# Patient Record
Sex: Female | Born: 2000 | Race: White | Hispanic: No | Marital: Single | Smoking: Current every day smoker
Health system: Southern US, Community
[De-identification: ages and names within clinical notes are randomized; demographics above are authoritative.]

## PROBLEM LIST (undated history)

## (undated) DIAGNOSIS — D649 Anemia, unspecified: Secondary | ICD-10-CM

## (undated) DIAGNOSIS — J45909 Unspecified asthma, uncomplicated: Secondary | ICD-10-CM

## (undated) DIAGNOSIS — B029 Zoster without complications: Secondary | ICD-10-CM

## (undated) HISTORY — PX: APPENDECTOMY: SHX54

## (undated) HISTORY — PX: TONSILLECTOMY: SUR1361

## (undated) HISTORY — PX: ADENOIDECTOMY: SUR15

---
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
## 2020-02-04 ENCOUNTER — Other Ambulatory Visit: Payer: Self-pay

## 2020-02-07 ENCOUNTER — Encounter: Payer: Self-pay | Admitting: Family Medicine

## 2020-02-07 ENCOUNTER — Other Ambulatory Visit: Payer: Self-pay

## 2020-02-07 ENCOUNTER — Ambulatory Visit: Payer: 59 | Admitting: Family Medicine

## 2020-02-07 VITALS — BP 118/82 | HR 101 | Temp 99.8°F | Ht 66.07 in | Wt 224.2 lb

## 2020-02-07 DIAGNOSIS — R42 Dizziness and giddiness: Secondary | ICD-10-CM | POA: Diagnosis not present

## 2020-02-07 DIAGNOSIS — Z833 Family history of diabetes mellitus: Secondary | ICD-10-CM | POA: Diagnosis not present

## 2020-02-07 DIAGNOSIS — R5383 Other fatigue: Secondary | ICD-10-CM

## 2020-02-07 DIAGNOSIS — R55 Syncope and collapse: Secondary | ICD-10-CM

## 2020-02-07 LAB — URINALYSIS, COMPLETE
Bilirubin, UA: NEGATIVE
Glucose, UA: NEGATIVE
Ketones, UA: NEGATIVE
Leukocytes,UA: NEGATIVE
Nitrite, UA: NEGATIVE
Protein,UA: NEGATIVE
RBC, UA: NEGATIVE
Specific Gravity, UA: 1.015 (ref 1.005–1.030)
Urobilinogen, Ur: 0.2 mg/dL (ref 0.2–1.0)
pH, UA: 7.5 (ref 5.0–7.5)

## 2020-02-07 LAB — BAYER DCA HB A1C WAIVED: HB A1C (BAYER DCA - WAIVED): 4.9 % (ref <7.0)

## 2020-02-07 LAB — MICROSCOPIC EXAMINATION
Epithelial Cells (non renal): >10 /hpf — AB (ref 0–10)
RBC: NONE SEEN /hpf (ref 0–2)
Renal Epithel, UA: NONE SEEN /hpf

## 2020-02-07 LAB — GLUCOSE HEMOCUE WAIVED: Glu Hemocue Waived: 94 mg/dL (ref 65–99)

## 2020-02-07 NOTE — Progress Notes (Signed)
BP 118/82   Pulse (!) 101   Temp 99.8 F (37.7 C) (Temporal)   Ht 5' 6.07" (1.678 m)   Wt 224 lb 3.2 oz (101.7 kg)   LMP 01/13/2020 (Approximate)   SpO2 92%   BMI 36.11 kg/m    Subjective:   Patient ID: Melissa Weeks, female    DOB: 06/01/2001, 19 y.o.   MRN: [id number]  HPI: Melissa Weeks is a 19 y.o. female presenting on 02/07/2020 for Dizziness (Patient states that she feels like her bs is dropping.  Has been going on since Nov but now worse. Will come out of no where and feel dizzy and feel like her bs is dropping)   HPI Patient is coming in today with complaints of dizziness and lightheadedness episodes that have been occurring over the past few months.  She says that she will feel shaky and lightheaded and nauseous and it will usually last between 10 and 30 minutes and it improves after eating something sugary.  She says is been increasing over the past few months and frequency and will happen every few days sometimes.  She has been at college more and has been stressed but she is seeing a counselor for that and she says that is working well and has not led to or been associated with these episodes.  She is concerned about her blood sugar.  She does have a mother who has type 1 diabetes.  She also complains of some heat intolerance and thirst that is been going on over the past few months as well.  She does have irritable bowel syndrome and get some diarrhea from that but it is been no more than she has had most of her life.  She says she does get some minor headaches associated with it but nothing major.  She says one time it happened out at [employer] with her father.  She has never fully passed out and has been able to sit down and eat and feel better most of the time.  She does try to drink water but does not know if she has been dehydrated.  Relevant past medical, surgical, family and social history reviewed and updated as indicated. Interim medical history since our last visit  reviewed. Allergies and medications reviewed and updated.  Review of Systems  Constitutional: Negative for chills and fever.  HENT: Negative for congestion, ear discharge and ear pain.   Eyes: Negative for redness and visual disturbance.  Respiratory: Negative for chest tightness and shortness of breath.   Cardiovascular: Negative for chest pain and leg swelling.  Endocrine: Positive for heat intolerance and polydipsia. Negative for cold intolerance and polyuria.  Genitourinary: Negative for difficulty urinating and dysuria.  Musculoskeletal: Negative for back pain and gait problem.  Skin: Negative for rash.  Neurological: Positive for dizziness and light-headedness. Negative for seizures, weakness and headaches.  Psychiatric/Behavioral: Negative for agitation and behavioral problems.  All other systems reviewed and are negative.   Per HPI unless specifically indicated above   Allergies as of 02/07/2020   No Known Allergies     Medication List       Accurate as of February 07, 2020  4:45 PM. If you have any questions, ask your nurse or doctor.        levonorgestrel-ethinyl estradiol 0.1-20 MG-MCG tablet Commonly known as: Sronyx Take 1 tablet by mouth daily.        Objective:   BP 118/82   Pulse (!) 101   Temp  99.8 F (37.7 C) (Temporal)   Ht 5' 6.07" (1.678 m)   Wt 224 lb 3.2 oz (101.7 kg)   LMP 01/13/2020 (Approximate)   SpO2 92%   BMI 36.11 kg/m   Wt Readings from Last 3 Encounters:  02/07/20 224 lb 3.2 oz (101.7 kg) (99 %, Z= 2.24)*  02/15/19 216 lb 6.4 oz (98.2 kg) (99 %, Z= 2.17)*  12/14/18 207 lb (93.9 kg) (98 %, Z= 2.08)*   * Growth percentiles are based on CDC (Girls, 2-20 Years) data.    Physical Exam Vitals and nursing note reviewed.  Constitutional:      General: She is not in acute distress.    Appearance: She is well-developed. She is not diaphoretic.  Eyes:     Conjunctiva/sclera: Conjunctivae normal.  Cardiovascular:     Rate and Rhythm:  Normal rate and regular rhythm.     Heart sounds: Normal heart sounds. No murmur.  Pulmonary:     Effort: Pulmonary effort is normal. No respiratory distress.     Breath sounds: Normal breath sounds. No wheezing.  Abdominal:     General: Abdomen is flat. Bowel sounds are normal. There is no distension.     Tenderness: There is no abdominal tenderness. There is no right CVA tenderness or left CVA tenderness.  Musculoskeletal:        General: No tenderness. Normal range of motion.  Skin:    General: Skin is warm and dry.     Findings: No rash.  Neurological:     Mental Status: She is alert and oriented to person, place, and time.     Coordination: Coordination normal.  Psychiatric:        Behavior: Behavior normal.       Assessment & Plan:   Problem List Items Addressed This Visit    None    Visit Diagnoses    Fatigue, unspecified type    -  Primary   Relevant Orders   Glucose Hemocue Waived (Completed)   CBC with Differential/Platelet   CMP14+EGFR   Thyroid Panel With TSH   Urinalysis, Complete   Bayer DCA Hb A1c Waived   Near syncope       Relevant Orders   CBC with Differential/Platelet   CMP14+EGFR   Thyroid Panel With TSH   Urinalysis, Complete   Bayer DCA Hb A1c Waived   Episodic lightheadedness       Relevant Orders   CBC with Differential/Platelet   CMP14+EGFR   Thyroid Panel With TSH   Urinalysis, Complete   Bayer DCA Hb A1c Waived   Family history of type 1 diabetes mellitus       Relevant Orders   CBC with Differential/Platelet   CMP14+EGFR   Thyroid Panel With TSH   Urinalysis, Complete   Bayer DCA Hb A1c Waived    There is a chance that it could be functional hypoglycemic episodes, her mother has type 1 diabetes so they will check her blood sugar when it occurs.  We will check blood work for any other possible issues.  Blood sugar 94 today  Follow up plan: Return in about 6 weeks (around 03/20/2020), or if symptoms worsen or fail to improve,  for Follow-up in 1 to 2 months on lightheadedness.  Counseling provided for all of the vaccine components Orders Placed This Encounter  Procedures  . Glucose Hemocue Waived  . CBC with Differential/Platelet  . CMP14+EGFR  . Thyroid Panel With TSH  . Urinalysis, Complete  . B[facility]  Hb A1c [place], MD [facility] 02/07/2020, 4:45 PM

## 2020-02-08 LAB — CMP14+EGFR
ALT: 12 IU/L (ref 0–32)
AST: 17 IU/L (ref 0–40)
Albumin/Globulin Ratio: 1.3 (ref 1.2–2.2)
Albumin: 4.4 g/dL (ref 3.9–5.0)
Alkaline Phosphatase: 78 IU/L (ref 43–101)
BUN/Creatinine Ratio: 8 — ABNORMAL LOW (ref 9–23)
BUN: 7 mg/dL (ref 6–20)
Bilirubin Total: 0.4 mg/dL (ref 0.0–1.2)
CO2: 21 mmol/L (ref 20–29)
Calcium: 9.9 mg/dL (ref 8.7–10.2)
Chloride: 102 mmol/L (ref 96–106)
Creatinine, Ser: 0.86 mg/dL (ref 0.57–1.00)
GFR calc Af Amer: 114 mL/min/{1.73_m2} (ref >59)
GFR calc non Af Amer: 99 mL/min/{1.73_m2} (ref >59)
Globulin, Total: 3.4 g/dL (ref 1.5–4.5)
Glucose: 79 mg/dL (ref 65–99)
Potassium: 4.8 mmol/L (ref 3.5–5.2)
Sodium: 138 mmol/L (ref 134–144)
Total Protein: 7.8 g/dL (ref 6.0–8.5)

## 2020-02-08 LAB — CBC WITH DIFFERENTIAL/PLATELET
Basophils Absolute: 0 10*3/uL (ref 0.0–0.2)
Basos: 0 %
EOS (ABSOLUTE): 0 10*3/uL (ref 0.0–0.4)
Eos: 0 %
Hematocrit: 45.5 % (ref 34.0–46.6)
Hemoglobin: 14.9 g/dL (ref 11.1–15.9)
Immature Grans (Abs): 0 10*3/uL (ref 0.0–0.1)
Immature Granulocytes: 0 %
Lymphocytes Absolute: 1.6 10*3/uL (ref 0.7–3.1)
Lymphs: 18 %
MCH: 27.5 pg (ref 26.6–33.0)
MCHC: 32.7 g/dL (ref 31.5–35.7)
MCV: 84 fL (ref 79–97)
Monocytes Absolute: 0.6 10*3/uL (ref 0.1–0.9)
Monocytes: 7 %
Neutrophils Absolute: 6.6 10*3/uL (ref 1.4–7.0)
Neutrophils: 75 %
Platelets: 293 10*3/uL (ref 150–450)
RBC: 5.42 x10E6/uL — ABNORMAL HIGH (ref 3.77–5.28)
RDW: 13 % (ref 11.7–15.4)
WBC: 8.8 10*3/uL (ref 3.4–10.8)

## 2020-02-08 LAB — THYROID PANEL WITH TSH
Free Thyroxine Index: 3.7 (ref 1.2–4.9)
T3 Uptake Ratio: 25 % (ref 23–35)
T4, Total: 14.6 ug/dL — ABNORMAL HIGH (ref 4.5–12.0)
TSH: 0.874 u[IU]/mL (ref 0.450–4.500)

## 2020-03-04 ENCOUNTER — Other Ambulatory Visit: Payer: Self-pay | Admitting: Family Medicine

## 2020-03-04 DIAGNOSIS — Z30011 Encounter for initial prescription of contraceptive pills: Secondary | ICD-10-CM

## 2020-03-15 ENCOUNTER — Other Ambulatory Visit: Payer: Self-pay

## 2020-03-15 ENCOUNTER — Ambulatory Visit: Payer: 59 | Admitting: Family Medicine

## 2020-03-15 ENCOUNTER — Encounter: Payer: Self-pay | Admitting: Family Medicine

## 2020-03-15 VITALS — BP 119/76 | HR 85 | Temp 99.3°F | Ht 66.0 in | Wt 219.4 lb

## 2020-03-15 DIAGNOSIS — E162 Hypoglycemia, unspecified: Secondary | ICD-10-CM | POA: Diagnosis not present

## 2020-03-15 NOTE — Progress Notes (Signed)
BP 119/76   Pulse 85   Temp 99.3 F (37.4 C)   Ht 5\' 6"  (1.676 m)   Wt 219 lb 6 oz (99.5 kg)   SpO2 100%   BMI 35.41 kg/m    Subjective:   Patient ID: , female    DOB: [birth year], 19 y.o.   MRN: [id number]  HPI: Melissa Weeks is a 19 y.o. female presenting on 03/15/2020 for Medical Management of Chronic Issues and Fatigue (and lightheadedness. Less frequent, but improved since last visit)   HPI Patient comes in today to rediscuss lightheadedness and dizziness.  She has kept a log of it and it seems like every time it happened has been on a day where she did not eat breakfast in the morning and she has a little bit of lightheadedness and then she eats something that goes away.  She has had 2 episodes on February 10 and one episode on February 13 and one episode yesterday and one episode this morning.  It is always been related to her not eating and then she does something and then has the episode and is when she eats it goes away.  Relevant past medical, surgical, family and social history reviewed and updated as indicated. Interim medical history since our last visit reviewed. Allergies and medications reviewed and updated.  Review of Systems  Constitutional: Negative for chills and fever.  Eyes: Negative for visual disturbance.  Respiratory: Negative for chest tightness and shortness of breath.   Cardiovascular: Negative for chest pain and leg swelling.  Musculoskeletal: Negative for back pain and gait problem.  Skin: Negative for rash.  Neurological: Positive for dizziness and light-headedness. Negative for headaches.  Psychiatric/Behavioral: Negative for agitation and behavioral problems.  All other systems reviewed and are negative.   Per HPI unless specifically indicated above   Allergies as of 03/15/2020   No Known Allergies     Medication List       Accurate as of March 15, 2020 12:40 PM. If you have any questions, ask your nurse or doctor.         Sronyx 0.1-20 MG-MCG tablet Generic drug: levonorgestrel-ethinyl estradiol TAKE 1 TABLET BY MOUTH EVERY DAY        Objective:   BP 119/76   Pulse 85   Temp 99.3 F (37.4 C)   Ht 5\' 6"  (1.676 m)   Wt 219 lb 6 oz (99.5 kg)   SpO2 100%   BMI 35.41 kg/m   Wt Readings from Last 3 Encounters:  03/15/20 219 lb 6 oz (99.5 kg) (99 %, Z= 2.19)*  02/07/20 224 lb 3.2 oz (101.7 kg) (99 %, Z= 2.24)*  02/15/19 216 lb 6.4 oz (98.2 kg) (99 %, Z= 2.17)*   * Growth percentiles are based on CDC (Girls, 2-20 Years) data.    Physical Exam Vitals and nursing note reviewed.  Constitutional:      General: She is not in acute distress.    Appearance: She is well-developed. She is not diaphoretic.  Eyes:     Conjunctiva/sclera: Conjunctivae normal.  Cardiovascular:     Rate and Rhythm: Normal rate and regular rhythm.     Heart sounds: Normal heart sounds. No murmur.  Pulmonary:     Effort: Pulmonary effort is normal. No respiratory distress.     Breath sounds: Normal breath sounds. No wheezing.  Musculoskeletal:        General: No tenderness. Normal range of motion.  Skin:    General: Skin is  warm and dry.  Neurological:     Mental Status: She is alert and oriented to person, place, and time.     Coordination: Coordination normal.  Psychiatric:        Behavior: Behavior normal.       Assessment & Plan:   Problem List Items Addressed This Visit    None    Visit Diagnoses    Nondiabetic hypoglycemia    -  Primary    Hormonal testing was normal, for now just keep snack with her, if it worsens we could always discuss with an endocrinology.  Follow up plan: Return if symptoms worsen or fail to improve.  Counseling provided for all of the vaccine components No orders of the defined types were placed in this encounter.   Caryl Pina, MD [facility] 03/15/2020, 12:40 PM

## 2020-03-24 ENCOUNTER — Ambulatory Visit: Payer: 59 | Admitting: Family Medicine

## 2020-05-06 ENCOUNTER — Other Ambulatory Visit: Payer: Self-pay | Admitting: Nurse Practitioner

## 2020-05-06 ENCOUNTER — Other Ambulatory Visit: Payer: Self-pay | Admitting: Family Medicine

## 2020-05-06 DIAGNOSIS — Z30011 Encounter for initial prescription of contraceptive pills: Secondary | ICD-10-CM

## 2020-05-06 MED ORDER — LEVONORGESTREL-ETHINYL ESTRAD 0.1-20 MG-MCG PO TABS: 1.0000 | ORAL_TABLET | Freq: Every day | ORAL | 1 refills | Status: DC

## 2020-09-29 ENCOUNTER — Encounter: Payer: Self-pay | Admitting: Nurse Practitioner

## 2020-09-29 ENCOUNTER — Ambulatory Visit (INDEPENDENT_AMBULATORY_CARE_PROVIDER_SITE_OTHER): Payer: 59 | Admitting: Nurse Practitioner

## 2020-09-29 ENCOUNTER — Other Ambulatory Visit: Payer: Self-pay

## 2020-09-29 VITALS — BP 124/76 | HR 101 | Temp 97.6°F | Ht 66.02 in | Wt 215.8 lb

## 2020-09-29 DIAGNOSIS — Z23 Encounter for immunization: Secondary | ICD-10-CM | POA: Diagnosis not present

## 2020-09-29 DIAGNOSIS — Z30011 Encounter for initial prescription of contraceptive pills: Secondary | ICD-10-CM | POA: Insufficient documentation

## 2020-09-29 MED ORDER — LEVONORGESTREL-ETHINYL ESTRAD 0.1-20 MG-MCG PO TABS: 1.0000 | ORAL_TABLET | Freq: Every day | ORAL | 1 refills | Status: DC

## 2020-09-29 NOTE — Progress Notes (Signed)
Established Patient Office Visit  Subjective:  Patient ID: Melissa Weeks, female    DOB: 2001-05-24  Age: 19 y.o. MRN: [id number]  CC:  Chief Complaint  Patient presents with  . Medication Refill    Birth Control     HPI Melissa Weeks is a 19 year old female who presents to clinic for birth control medication refill.  Patient reports compliant with birth control and has not had any side effect from medication.  Patient follows up as directed  Past Medical History:  Diagnosis Date  . Anemia   . Asthma     Past Surgical History:  Procedure Laterality Date  . ADENOIDECTOMY    . TONSILLECTOMY      Family History  Problem Relation Age of Onset  . Diabetes Mother        type 1    Social History   Socioeconomic History  . Marital status: Single    Spouse name: Not on file  . Number of children: Not on file  . Years of education: Not on file  . Highest education level: Not on file  Occupational History  . Not on file  Tobacco Use  . Smoking status: Never Smoker  . Smokeless tobacco: Never Used  Vaping Use  . Vaping Use: Never used  Substance and Sexual Activity  . Alcohol use: No  . Drug use: No  . Sexual activity: Yes    Birth control/protection: Condom  Other Topics Concern  . Not on file  Social History Narrative  . Not on file   Social Determinants of Health                                                                         Outpatient Medications Prior to Visit  Medication Sig Dispense Refill  . levonorgestrel-ethinyl estradiol (SRONYX) 0.1-20 MG-MCG tablet Take 1 tablet by mouth daily. 84 tablet 1  . SRONYX 0.1-20 MG-MCG tablet TAKE 1 TABLET BY MOUTH EVERY DAY 84 tablet 1   No facility-administered medications prior to visit.    No Known Allergies  ROS Review of Systems  Constitutional: Negative.   Genitourinary:       Last menstrual.  09/21/2020  All other systems reviewed and are negative.     Objective:     Physical Exam Vitals reviewed.  Constitutional:      Appearance: Normal appearance.  HENT:     Head: Normocephalic.  Eyes:     Conjunctiva/sclera: Conjunctivae normal.  Cardiovascular:     Rate and Rhythm: Normal rate.     Pulses: Normal pulses.     Heart sounds: Normal heart sounds.  Pulmonary:     Effort: Pulmonary effort is normal.     Breath sounds: Normal breath sounds.  Abdominal:     General: Bowel sounds are normal.  Musculoskeletal:        General: Normal range of motion.  Skin:    General: Skin is warm.  Neurological:     Mental Status: She is alert.  Psychiatric:        Mood and Affect: Mood normal.     BP 124/76   Pulse (!) 101   Temp 97.6 F (36.4 C) (Temporal)   Ht 5' 6.02" (1.677  m)   Wt 215 lb 12.8 oz (97.9 kg)   LMP 09/21/2020   SpO2 98%   BMI 34.81 kg/m  Wt Readings from Last 3 Encounters:  09/29/20 215 lb 12.8 oz (97.9 kg) (98 %, Z= 2.16)*  03/15/20 219 lb 6 oz (99.5 kg) (99 %, Z= 2.19)*  02/07/20 224 lb 3.2 oz (101.7 kg) (99 %, Z= 2.24)*   * Growth percentiles are based on CDC (Girls, 2-20 Years) data.     Health Maintenance Due  Topic Date Due  . Hepatitis C Screening  Never done  . COVID-19 Vaccine (1) Never done  . TETANUS/TDAP  Never done  . INFLUENZA VACCINE  07/30/2020    There are no preventive care reminders to display for this patient.  Lab Results  Component Value Date   TSH 0.874 02/07/2020   Lab Results  Component Value Date   WBC 8.8 02/07/2020   HGB 14.9 02/07/2020   HCT 45.5 02/07/2020   MCV 84 02/07/2020   PLT 293 02/07/2020   Lab Results  Component Value Date   NA 138 02/07/2020   K 4.8 02/07/2020   CO2 21 02/07/2020   GLUCOSE 79 02/07/2020   BUN 7 02/07/2020   CREATININE 0.86 02/07/2020   BILITOT 0.4 02/07/2020   ALKPHOS 78 02/07/2020   AST 17 02/07/2020   ALT 12 02/07/2020   PROT 7.8 02/07/2020   ALBUMIN 4.4 02/07/2020   CALCIUM 9.9 02/07/2020   No results found for: CHOL No results found  for: HDL No results found for: LDLCALC No results found for: TRIG No results found for: CHOLHDL Lab Results  Component Value Date   HGBA1C 4.9 02/07/2020      Assessment & Plan:   Problem List Items Addressed This Visit      Other   Encounter for initial prescription of contraceptive pills    Patient presents for prescription of contraceptive pill.  Patient last menstrual.  09/21/2020.  Patient is compliant with current medication and has no adverse reaction.  Provided education with printed handouts given. Rx refill sent to pharmacy.      Relevant Medications   levonorgestrel-ethinyl estradiol (SRONYX) 0.1-20 MG-MCG tablet      Meds ordered this encounter  Medications  . levonorgestrel-ethinyl estradiol (SRONYX) 0.1-20 MG-MCG tablet    Sig: Take 1 tablet by mouth daily.    Dispense:  84 tablet    Refill:  1    Order Specific Question:   Supervising Provider    Answer:   Arville Care A [[id number]]    Follow-up: Return if symptoms worsen or fail to improve.    Daryll Drown, NP

## 2020-09-29 NOTE — Assessment & Plan Note (Signed)
Patient presents for prescription of contraceptive pill.  Patient last menstrual.  09/21/2020.  Patient is compliant with current medication and has no adverse reaction.  Provided education with printed handouts given. Rx refill sent to pharmacy.

## 2020-09-29 NOTE — Patient Instructions (Signed)

## 2020-10-10 ENCOUNTER — Other Ambulatory Visit: Payer: Self-pay | Admitting: Nurse Practitioner

## 2020-10-10 DIAGNOSIS — Z30011 Encounter for initial prescription of contraceptive pills: Secondary | ICD-10-CM

## 2021-04-02 ENCOUNTER — Other Ambulatory Visit: Payer: Self-pay

## 2021-04-02 DIAGNOSIS — Z30011 Encounter for initial prescription of contraceptive pills: Secondary | ICD-10-CM

## 2021-04-09 ENCOUNTER — Other Ambulatory Visit: Payer: Self-pay

## 2021-04-09 DIAGNOSIS — Z30011 Encounter for initial prescription of contraceptive pills: Secondary | ICD-10-CM

## 2021-04-09 MED ORDER — LEVONORGESTREL-ETHINYL ESTRAD 0.1-20 MG-MCG PO TABS: 1.0000 | ORAL_TABLET | Freq: Every day | ORAL | 1 refills | Status: DC

## 2021-04-09 NOTE — Telephone Encounter (Signed)
Pt thought she was getting a year supply of evonorgestrel-ethinyl estradiol (SRONYX) 0.1-20 MG-MCG tablet. She called for a refill and was told she was out. Pt is completely out as of yesterday. Use [facility] in [place]. Please call back

## 2021-04-09 NOTE — Telephone Encounter (Signed)
NA/VM full. RCD after hr call, pt needing refill on OCP. TC with dad to clarify pharmacy, he will have pt rtc

## 2021-04-09 NOTE — Telephone Encounter (Signed)
Verified pharmcy & aware refill sent

## 2021-06-30 ENCOUNTER — Other Ambulatory Visit: Payer: Self-pay | Admitting: Family Medicine

## 2021-06-30 DIAGNOSIS — Z30011 Encounter for initial prescription of contraceptive pills: Secondary | ICD-10-CM

## 2021-07-03 ENCOUNTER — Telehealth: Payer: Self-pay | Admitting: Family Medicine

## 2021-07-03 DIAGNOSIS — Z30011 Encounter for initial prescription of contraceptive pills: Secondary | ICD-10-CM

## 2021-07-03 MED ORDER — LEVONORGESTREL-ETHINYL ESTRAD 0.1-20 MG-MCG PO TABS: 1.0000 | ORAL_TABLET | Freq: Every day | ORAL | 0 refills | Status: AC

## 2021-07-03 NOTE — Telephone Encounter (Signed)
  Prescription Request  07/03/2021  What is the name of the medication or equipment? Levonorgestrel-ethinyl estradiol 0.1-20 mg-mcg tablet. Patient was supposed to start 7-3 and she is out. Last seen was in 09-2020 with Je   Have you contacted your pharmacy to request a refill? (if applicable) yes  Which pharmacy would you like this sent to? [facility] on [address] in [place], [place]   Patient notified that their request is being sent to the clinical staff for review and that they should receive a response within 2 business days.

## 2021-07-03 NOTE — Telephone Encounter (Signed)
#  84 of OCP sent to [facility]. Asked pharmacy to have pt call for an appt. She has not been seen by Dettinger since 02/2020

## 2021-07-19 ENCOUNTER — Ambulatory Visit: Payer: 59 | Admitting: Family Medicine

## 2021-12-04 ENCOUNTER — Other Ambulatory Visit: Payer: Self-pay | Admitting: Family Medicine

## 2021-12-04 DIAGNOSIS — Z30011 Encounter for initial prescription of contraceptive pills: Secondary | ICD-10-CM

## 2022-02-24 ENCOUNTER — Other Ambulatory Visit: Payer: Self-pay | Admitting: Family Medicine

## 2022-02-24 DIAGNOSIS — Z30011 Encounter for initial prescription of contraceptive pills: Secondary | ICD-10-CM

## 2023-04-14 ENCOUNTER — Emergency Department (HOSPITAL_BASED_OUTPATIENT_CLINIC_OR_DEPARTMENT_OTHER): Payer: 59

## 2023-04-14 ENCOUNTER — Emergency Department (HOSPITAL_BASED_OUTPATIENT_CLINIC_OR_DEPARTMENT_OTHER)
Admission: EM | Admit: 2023-04-14 | Discharge: 2023-04-14 | Disposition: A | Discharge status: Home / Self Care | Payer: 59 | Attending: Emergency Medicine | Admitting: Emergency Medicine

## 2023-04-14 ENCOUNTER — Encounter (HOSPITAL_BASED_OUTPATIENT_CLINIC_OR_DEPARTMENT_OTHER): Payer: Self-pay | Admitting: Emergency Medicine

## 2023-04-14 ENCOUNTER — Ambulatory Visit (INDEPENDENT_AMBULATORY_CARE_PROVIDER_SITE_OTHER)
Admission: EM | Admit: 2023-04-14 | Discharge: 2023-04-14 | Disposition: A | Discharge status: Home / Self Care | Payer: 59 | Source: Home / Self Care

## 2023-04-14 ENCOUNTER — Other Ambulatory Visit: Payer: Self-pay

## 2023-04-14 DIAGNOSIS — R1011 Right upper quadrant pain: Secondary | ICD-10-CM | POA: Insufficient documentation

## 2023-04-14 DIAGNOSIS — B029 Zoster without complications: Secondary | ICD-10-CM | POA: Diagnosis not present

## 2023-04-14 LAB — URINALYSIS, MICROSCOPIC (REFLEX): RBC / HPF: NONE SEEN RBC/hpf (ref 0–5)

## 2023-04-14 LAB — COMPREHENSIVE METABOLIC PANEL
ALT: 24 U/L (ref 0–44)
AST: 31 U/L (ref 15–41)
Albumin: 3.9 g/dL (ref 3.5–5.0)
Alkaline Phosphatase: 60 U/L (ref 38–126)
Anion gap: 10 (ref 5–15)
BUN: 9 mg/dL (ref 6–20)
CO2: 24 mmol/L (ref 22–32)
Calcium: 8.9 mg/dL (ref 8.9–10.3)
Chloride: 102 mmol/L (ref 98–111)
Creatinine, Ser: 0.79 mg/dL (ref 0.44–1.00)
GFR, Estimated: >60 mL/min (ref >60)
Glucose, Bld: 96 mg/dL (ref 70–99)
Potassium: 4 mmol/L (ref 3.5–5.1)
Sodium: 136 mmol/L (ref 135–145)
Total Bilirubin: 1.1 mg/dL (ref 0.3–1.2)
Total Protein: 7.6 g/dL (ref 6.5–8.1)

## 2023-04-14 LAB — URINALYSIS, ROUTINE W REFLEX MICROSCOPIC
Bilirubin Urine: NEGATIVE
Bilirubin Urine: NEGATIVE
Glucose, UA: NEGATIVE mg/dL
Glucose, UA: NEGATIVE mg/dL
Hgb urine dipstick: NEGATIVE
Hgb urine dipstick: NEGATIVE
Ketones, ur: 15 mg/dL — AB
Ketones, ur: 15 mg/dL — AB
Nitrite: POSITIVE — AB
Nitrite: POSITIVE — AB
Protein, ur: NEGATIVE mg/dL
Protein, ur: NEGATIVE mg/dL
Specific Gravity, Urine: 1.025 (ref 1.005–1.030)
Specific Gravity, Urine: 1.025 (ref 1.005–1.030)
pH: 6.5 (ref 5.0–8.0)
pH: 7 (ref 5.0–8.0)

## 2023-04-14 LAB — POCT URINE PREGNANCY: Preg Test, Ur: NEGATIVE

## 2023-04-14 LAB — POCT URINALYSIS DIP (MANUAL ENTRY)
Bilirubin, UA: NEGATIVE
Blood, UA: NEGATIVE
Glucose, UA: NEGATIVE mg/dL
Ketones, POC UA: NEGATIVE mg/dL
Nitrite, UA: NEGATIVE
Protein Ur, POC: NEGATIVE mg/dL
Spec Grav, UA: >=1.03 — AB (ref 1.010–1.025)
Urobilinogen, UA: 0.2 E.U./dL
pH, UA: 6 (ref 5.0–8.0)

## 2023-04-14 LAB — CBC
HCT: 42.7 % (ref 36.0–46.0)
Hemoglobin: 14 g/dL (ref 12.0–15.0)
MCH: 28.2 pg (ref 26.0–34.0)
MCHC: 32.8 g/dL (ref 30.0–36.0)
MCV: 85.9 fL (ref 80.0–100.0)
Platelets: 217 10*3/uL (ref 150–400)
RBC: 4.97 MIL/uL (ref 3.87–5.11)
RDW: 13.7 % (ref 11.5–15.5)
WBC: 6.3 10*3/uL (ref 4.0–10.5)
nRBC: 0 % (ref 0.0–0.2)

## 2023-04-14 LAB — LIPASE, BLOOD: Lipase: 27 U/L (ref 11–51)

## 2023-04-14 LAB — PREGNANCY, URINE: Preg Test, Ur: NEGATIVE

## 2023-04-14 LAB — D-DIMER, QUANTITATIVE: D-Dimer, Quant: 0.31 ug/mL-FEU (ref 0.00–0.50)

## 2023-04-14 MED ORDER — IOHEXOL 300 MG/ML  SOLN
100.0000 mL | Freq: Once | INTRAMUSCULAR | Status: AC | PRN
  Administered 2023-04-14: 100 mL via INTRAVENOUS

## 2023-04-14 MED ORDER — VALACYCLOVIR HCL 500 MG PO TABS
1000.0000 mg | ORAL_TABLET | Freq: Once | ORAL | Status: AC
  Administered 2023-04-14: 1000 mg via ORAL
  Filled 2023-04-14: qty 2

## 2023-04-14 MED ORDER — OMEPRAZOLE 20 MG PO CPDR: 20.0000 mg | DELAYED_RELEASE_CAPSULE | Freq: Every day | ORAL | 0 refills | Status: DC

## 2023-04-14 MED ORDER — LACTATED RINGERS IV BOLUS
1000.0000 mL | Freq: Once | INTRAVENOUS | Status: AC
  Administered 2023-04-14: 1000 mL via INTRAVENOUS

## 2023-04-14 MED ORDER — VALACYCLOVIR HCL 1 G PO TABS: 1000.0000 mg | ORAL_TABLET | Freq: Three times a day (TID) | ORAL | 0 refills | Status: AC

## 2023-04-14 MED ORDER — FENTANYL CITRATE PF 50 MCG/ML IJ SOSY
50.0000 ug | PREFILLED_SYRINGE | Freq: Once | INTRAMUSCULAR | Status: AC
  Administered 2023-04-14: 50 ug via INTRAVENOUS
  Filled 2023-04-14: qty 1

## 2023-04-14 MED ORDER — LIDOCAINE 5 % EX PTCH
1.0000 | MEDICATED_PATCH | CUTANEOUS | Status: DC
  Administered 2023-04-14: 1 via TRANSDERMAL
  Filled 2023-04-14: qty 1

## 2023-04-14 MED ORDER — PANTOPRAZOLE SODIUM 40 MG PO TBEC
40.0000 mg | DELAYED_RELEASE_TABLET | Freq: Every day | ORAL | Status: DC
  Administered 2023-04-14: 40 mg via ORAL
  Filled 2023-04-14: qty 1

## 2023-04-14 MED ORDER — ONDANSETRON HCL 4 MG/2ML IJ SOLN
4.0000 mg | Freq: Once | INTRAMUSCULAR | Status: AC
  Administered 2023-04-14: 4 mg via INTRAVENOUS
  Filled 2023-04-14: qty 2

## 2023-04-14 MED ORDER — MORPHINE SULFATE (PF) 2 MG/ML IV SOLN
2.0000 mg | Freq: Once | INTRAVENOUS | Status: AC
  Administered 2023-04-14: 2 mg via INTRAVENOUS
  Filled 2023-04-14: qty 1

## 2023-04-14 NOTE — ED Provider Notes (Signed)
[place] [facility] Provider Note   CSN: [id number] Arrival date & time: 04/14/23  1129     History Chief Complaint  Patient presents with   Abdominal Pain    Melissa Weeks is a 22 y.o. female with history of anxiety depression presents the emergency room today for evaluation of right upper quadrant pain that radiates to her right back yesterday.  She reports has been constant.  Reports it is a 7 out of 10.  She reports that she vomited twice but is not any nausea or diarrhea.  She denies any dysuria, hematuria, vaginal discharge, vaginal bleeding.  Denies any chest pain or shortness of breath.  Denies any fevers or recent cough or cold symptoms.  She denies any trauma to the area.  Patient is on OCPs.  Denies any alcohol use.  Occasional marijuana use.  Vapes.   Abdominal Pain Associated symptoms: vomiting   Associated symptoms: no chest pain, no chills, no constipation, no cough, no diarrhea, no dysuria, no fever, no hematuria, no nausea, no shortness of breath, no sore throat, no vaginal bleeding and no vaginal discharge        Home Medications Prior to Admission medications   Medication Sig Start Date End Date Taking? Authorizing Provider  cariprazine (VRAYLAR) 1.5 MG capsule 3 mg. 11/29/21   [provider]  levonorgestrel-ethinyl estradiol (VIENVA) 0.1-20 MG-MCG tablet Take 1 tablet by mouth daily. 07/03/21   Dettinger, Elige Radon, MD  traZODone (DESYREL) 100 MG tablet Take 50-100 mg by mouth at bedtime.    [provider]      Allergies    Patient has no known allergies.    Review of Systems   Review of Systems  Constitutional:  Negative for chills and fever.  HENT:  Negative for congestion, rhinorrhea and sore throat.   Respiratory:  Negative for cough and shortness of breath.   Cardiovascular:  Negative for chest pain.  Gastrointestinal:  Positive for abdominal pain and vomiting. Negative for constipation, diarrhea  and nausea.  Genitourinary:  Positive for flank pain. Negative for dysuria, hematuria, urgency, vaginal bleeding and vaginal discharge.  Skin:  Negative for rash.    Physical Exam Updated Vital Signs BP 122/69   Pulse 95   Temp 98.5 F (36.9 C)   Resp 18   Ht 5\' 6"  (1.676 m)   Wt 104.3 kg   LMP 04/04/2023   SpO2 99%   BMI 37.12 kg/m   Physical Exam Vitals and nursing note reviewed.  Constitutional:      General: She is not in acute distress.    Appearance: Normal appearance. She is not toxic-appearing.  HENT:     Head: Normocephalic and atraumatic.  Eyes:     General: No scleral icterus. Cardiovascular:     Rate and Rhythm: Normal rate and regular rhythm.  Pulmonary:     Effort: Pulmonary effort is normal. No respiratory distress.     Breath sounds: Normal breath sounds.  Abdominal:     General: Bowel sounds are normal. There is no distension.     Palpations: Abdomen is soft.     Tenderness: There is abdominal tenderness in the right upper quadrant. There is right CVA tenderness. There is no guarding or rebound. Positive signs include Murphy's sign.     Comments: No overlying skin changes noted to the abdomen, back, or flank. Significant tenderness to light palpation over the area.  She does have positive Murphy's signs, but again is  tender to light touch as well.  Some right CVA tenderness.  No left CVA tenderness.  Normal active bowel sounds.  Musculoskeletal:        General: No deformity.     Cervical back: Normal range of motion.  Skin:    General: Skin is warm and dry.  Neurological:     General: No focal deficit present.     Mental Status: She is alert. Mental status is at baseline.     ED Results / Procedures / Treatments   Labs (all labs ordered are listed, but only abnormal results are displayed) Labs Reviewed  URINALYSIS, ROUTINE W REFLEX MICROSCOPIC - Abnormal; Notable for the following components:      Result Value   APPearance CLOUDY (*)     Ketones, ur 15 (*)    Nitrite POSITIVE (*)    Leukocytes,Ua MODERATE (*)    All other components within normal limits  URINALYSIS, MICROSCOPIC (REFLEX) - Abnormal; Notable for the following components:   Bacteria, UA FEW (*)    All other components within normal limits  URINALYSIS, ROUTINE W REFLEX MICROSCOPIC - Abnormal; Notable for the following components:   APPearance HAZY (*)    Ketones, ur 15 (*)    Nitrite POSITIVE (*)    Leukocytes,Ua TRACE (*)    All other components within normal limits  URINALYSIS, MICROSCOPIC (REFLEX) - Abnormal; Notable for the following components:   Bacteria, UA FEW (*)    All other components within normal limits  URINE CULTURE  LIPASE, BLOOD  COMPREHENSIVE METABOLIC PANEL  CBC  PREGNANCY, URINE  D-DIMER, QUANTITATIVE    EKG None  Radiology CT ABDOMEN PELVIS W CONTRAST  Result Date: 04/14/2023 CLINICAL DATA:  Abdominal, flank pain.  Right upper quadrant pain. EXAM: CT ABDOMEN AND PELVIS WITH CONTRAST TECHNIQUE: Multidetector CT imaging of the abdomen and pelvis was performed using the standard protocol following bolus administration of intravenous contrast. RADIATION DOSE REDUCTION: This exam was performed according to the departmental dose-optimization program which includes automated exposure control, adjustment of the mA and/or kV according to patient size and/or use of iterative reconstruction technique. CONTRAST:  OMNIPAQUE IOHEXOL 300 MG/ML  SOLN COMPARISON:  None Available. FINDINGS: Lower chest: No acute abnormality. Hepatobiliary: No focal liver abnormality is seen. No gallstones, gallbladder wall thickening, or biliary dilatation. Pancreas: Unremarkable. No pancreatic ductal dilatation or surrounding inflammatory changes. Spleen: Normal in size without focal abnormality. Adrenals/Urinary Tract: Adrenal glands are unremarkable. Kidneys are normal, without renal calculi, focal lesion, or hydronephrosis. Bladder is unremarkable.  Stomach/Bowel: Stomach is within normal limits. Appendix surgically absent. No evidence of bowel wall thickening, distention, or inflammatory changes. Vascular/Lymphatic: No significant vascular findings are present. No enlarged abdominal or pelvic lymph nodes. Reproductive: Uterus and bilateral adnexa are unremarkable. Other: No abdominal wall hernia or abnormality. No abdominopelvic ascites. Musculoskeletal: No acute or significant osseous findings. IMPRESSION: No acute intra-abdominal pathology. Electronically Signed   By: Emmaline Kluver M.D.   On: 04/14/2023 17:47   US Abdomen Limited RUQ (LIVER/GB)  Result Date: 04/14/2023 CLINICAL DATA:  Right upper quadrant pain EXAM: ULTRASOUND ABDOMEN LIMITED RIGHT UPPER QUADRANT COMPARISON:  None Available. FINDINGS: Gallbladder: No gallstones or wall thickening visualized. Positive sonographic Murphy sign noted by sonographer. Common bile duct: Diameter: 0.3 cm Liver: No focal lesion identified. Within normal limits in parenchymal echogenicity. Portal vein is patent on color Doppler imaging with normal direction of blood flow towards the liver. Other: None. IMPRESSION: Positive sonographic Murphy's sign noted by the sonographer.  Normal sonographic appearance of the gallbladder. Electronically Signed   By: Emmaline Kluver M.D.   On: 04/14/2023 15:08    Procedures Procedures   Medications Ordered in ED Medications  ondansetron (ZOFRAN) injection 4 mg (4 mg Intravenous Given 04/14/23 1604)  fentaNYL (SUBLIMAZE) injection 50 mcg (50 mcg Intravenous Given 04/14/23 1605)  lactated ringers bolus 1,000 mL (0 mLs Intravenous Stopped 04/14/23 1730)  iohexol (OMNIPAQUE) 300 MG/ML solution 100 mL (100 mLs Intravenous Contrast Given 04/14/23 1727)    ED Course/ Medical Decision Making/ A&P    Medical Decision Making Amount and/or Complexity of Data Reviewed Labs: ordered. Radiology: ordered.  Risk Prescription drug management.   22 y.o. female presents to  the ER for evaluation of right upper quadrant. Differential diagnosis includes but is not limited to AAA, mesenteric ischemia, appendicitis, diverticulitis, DKA, gastroenteritis, nephrolithiasis, pancreatitis, constipation, UTI, bowel obstruction, biliary disease, IBD, PUD, hepatitis, ectopic pregnancy, PE. Vital signs unremarkable. Physical exam as noted above.   RUQ [place] ordered.   I independently reviewed and interpreted the patient's labs.  D-dimer negative.  Pregnancy test is negative.  Lipase within normal limits.  CBC without leukocytosis or anemia.  CMP shows no electrolyte or LFT abnormality.  Initial urine was slightly dirty catch with 25-50 squames.  Free catch shows hazy urine with 15 ketones.  It is nitrate positive with trace amount of leukocytes.  There is few bacteria with 0-5 white blood cell however 6-10 squames are present as well.  Will still culture the urine.  She does not have any urinary symptoms nor does she have any signs of pyelonephritis on her CT imaging.  Discussed my attending.  Will hold off on giving antibiotics at this time.  Right upper quadrant ultrasound shows  Positive sonographic Murphy's sign noted by the sonographer. Normal sonographic appearance of the gallbladder.   CT Abd shows no acute intra-abdominal pathology.  Attending assessed at bedside. Question early shingles and/or duodenal ulcer.  The patient reports that she has been more stressed lately.  She is up-to-date on all of her childhood vaccinations, but did not have any chickenpox as a child.  There are is no rash or skin changes noted to the area where she has pain, but she does have pain in a dermatomal pattern.  Will treat with Valtrex.  Will also give omeprazole for possible duodenal ulcer although the patient does not have any melena or hematochezia.  Morphine,Valtrex and pantoprazole given in the ER.  After consideration of the diagnostic results and the patients response to treatment, I feel that  the patient can be discharged home with close outpatient follow up .   We discussed the results of the labs/imaging. The plan is to take Valtrex 3 times daily for 7 days as prescribed, omeprazole for 14 days, he wash while in the area.  Follow-up with GI.  Lidocaine patches and NSAIDs for pain. We discussed strict return precautions and red flag symptoms. The patient verbalized their understanding and agrees to the plan. The patient is stable and being discharged home in good condition.  Portions of this report may have been transcribed using voice recognition software. Every effort was made to ensure accuracy; however, inadvertent computerized transcription errors may be present.    I discussed this case with my attending physician who cosigned this note including patient's presenting symptoms, physical exam, and planned diagnostics and interventions. Attending physician stated agreement with plan or made changes to plan which were implemented.   Attending physician assessed patient  at bedside.  Final Clinical Impression(s) / ED Diagnoses Final diagnoses:  Herpes zoster without complication  RUQ abdominal pain    Rx / DC Orders ED Discharge Orders          Ordered    valACYclovir (VALTREX) 1000 MG tablet  3 times daily        04/14/23 1956    omeprazole (PRILOSEC) 20 MG capsule  Daily        04/14/23 1956              Achille Rich, Cordelia Poche 04/14/23 2027    Ernie Avena, MD 04/15/23 [phone]

## 2023-04-14 NOTE — ED Triage Notes (Signed)
Pt c/o RUQ pain radiating around to rt flank since yesterday and vomiting x2 today d/t pain. States took tylenol and zofran with no relief.

## 2023-04-14 NOTE — Discharge Instructions (Addendum)
You were seen in the ER today for evaluation of your right upper quadrant abdominal pain.  Your imaging was unremarkable for any acute pathology.  Urine did show some signs of infection but you do not have any urinary symptoms.  I will culture the urine, some will call you back if this grows out anything substantial.  Given your tenderness to light touch, we are suspecting possible early shingles.  Will place you on medication called Valtrex which she will take 3 times daily for the next 7 days.  I have sent this into the 24-hour pharmacy.  I am also sending in a medication called omeprazole which will take daily for the next 2 weeks.  You can try topical lidocaine patches to the area.  I also recommend ibuprofen 600 mg every 6 hours as needed for pain.  If you have any concerns, new or worsening symptoms, please return to the nearest emergency department for evaluation.   Contact a doctor if: Your pain does not get better with medicine. Your pain does not get better after the rash heals. You have any of these signs of infection around the rash: More redness, swelling, or pain. Fluid or blood. Warmth. Pus or a bad smell. You have a fever. Get help right away if: The rash is on your face or nose. You have pain in your face or pain by your eye. You lose feeling on one side of your face. You have trouble seeing. You have ear pain, or you have ringing in your ear. You have a loss of taste. Your condition gets worse.

## 2023-04-14 NOTE — ED Notes (Signed)
Spoke with lab to add on urine culture.  

## 2023-04-14 NOTE — ED Provider Notes (Signed)
[facility]    CSN: [id number] Arrival date & time: 04/14/23  1914      History   Chief Complaint Chief Complaint  Patient presents with   Flank Pain    HPI Melissa Weeks is a 22 y.o. female presents for evaluation of abdominal pain.  Patient reports yesterday she had a consistent worsening right upper quadrant abdominal pain that does radiate around to her back.  It is associated with nausea and vomiting.  She is also had some chills but denies fevers, abdominal bloating, dysuria.  She is not sure if eating yesterday made her symptoms worse.  She reports she had a bowel movement yesterday that was normal for her.  States she is passing gas.  No history of cholecystitis in the past.  Abdominal surgeries include appendectomy.  No other concerns at this time.   Flank Pain Associated symptoms include abdominal pain.    Past Medical History:  Diagnosis Date   Anemia    Asthma     Patient Active Problem List   Diagnosis Date Noted   Encounter for initial prescription of contraceptive pills 09/29/2020    Past Surgical History:  Procedure Laterality Date   ADENOIDECTOMY     TONSILLECTOMY      OB History   No obstetric history on file.      Home Medications    Prior to Admission medications   Medication Sig Start Date End Date Taking? Authorizing Provider  cariprazine (VRAYLAR) 1.5 MG capsule 3 mg. 11/29/21  Yes [provider]  levonorgestrel-ethinyl estradiol (VIENVA) 0.1-20 MG-MCG tablet Take 1 tablet by mouth daily. 07/03/21   Dettinger, Elige Radon, MD  traZODone (DESYREL) 100 MG tablet Take 50-100 mg by mouth at bedtime.    [provider]    Family History Family History  Problem Relation Age of Onset   Diabetes Mother        type 1    Social History Social History   Tobacco Use   Smoking status: Never   Smokeless tobacco: Never  Vaping Use   Vaping Use: Never used  Substance Use Topics   Alcohol use: No   Drug use: No      Allergies   Patient has no known allergies.   Review of Systems Review of Systems  Gastrointestinal:  Positive for abdominal pain.     Physical Exam Triage Vital Signs ED Triage Vitals  Enc Vitals Group     BP 04/14/23 1040 125/83     Pulse Rate 04/14/23 1040 75     Resp 04/14/23 1040 20     Temp 04/14/23 1040 98.8 F (37.1 C)     Temp Source 04/14/23 1040 Oral     SpO2 04/14/23 1040 98 %     Weight --      Height --      Head Circumference --      Peak Flow --      Pain Score 04/14/23 1058 7     Pain Loc --      Pain Edu? --      Excl. in GC? --    No data found.  Updated Vital Signs BP 125/83 (BP Location: Right Arm)   Pulse 75   Temp 98.8 F (37.1 C) (Oral)   Resp 20   LMP 04/04/2023   SpO2 98%   Visual Acuity Right Eye Distance:   Left Eye Distance:   Bilateral Distance:    Right Eye Near:   Left  Eye Near:    Bilateral Near:     Physical Exam Vitals and nursing note reviewed.  Constitutional:      General: She is not in acute distress.    Appearance: Normal appearance. She is not ill-appearing.  HENT:     Head: Normocephalic and atraumatic.  Eyes:     Pupils: Pupils are equal, round, and reactive to light.  Cardiovascular:     Rate and Rhythm: Normal rate.  Pulmonary:     Effort: Pulmonary effort is normal.  Abdominal:     Palpations: There is no hepatomegaly or splenomegaly.     Tenderness: There is abdominal tenderness in the right upper quadrant. There is no right CVA tenderness or left CVA tenderness.     Comments: Significant tenderness to palpation to right upper quadrant that does wrap around to her right upper back.  Skin:    General: Skin is warm and dry.  Neurological:     General: No focal deficit present.     Mental Status: She is alert and oriented to person, place, and time.  Psychiatric:        Mood and Affect: Mood normal.        Behavior: Behavior normal.      UC Treatments / Results  Labs (all labs ordered  are listed, but only abnormal results are displayed) Labs Reviewed  POCT URINALYSIS DIP (MANUAL ENTRY) - Abnormal; Notable for the following components:      Result Value   Clarity, UA cloudy (*)    Spec Grav, UA >=1.030 (*)    Leukocytes, UA Small (1+) (*)    All other components within normal limits  URINE CULTURE  POCT URINE PREGNANCY    EKG   Radiology No results found.  Procedures Procedures (including critical care time)  Medications Ordered in UC Medications - No data to display  Initial Impression / Assessment and Plan / UC Course  I have reviewed the triage vital signs and the nursing notes.  Pertinent labs & imaging results that were available during my care of the patient were reviewed by me and considered in my medical decision making (see chart for details).     Reviewed exam and symptoms with patient.  UA with small leuks but patient is asymptomatic.  Will culture.  Discussed limitations and abilities of urgent care.  Symptoms concerning for possible cholecystitis.  Advise she go to the emergency room for further evaluation and treatment as I am unable to work this up in the setting.  Patient is in agreement with plan will go POV to the ER.  She was instructed to pull over and call 911 for any worsening symptoms that occur in transit and she verbalized understanding. Final Clinical Impressions(s) / UC Diagnoses   Final diagnoses:  Right upper quadrant abdominal pain     Discharge Instructions      Please go to the ER for further evaluation of your abdominal pain    ED Prescriptions   None    PDMP not reviewed this encounter.   Radford Pax, NP 04/14/23 1120

## 2023-04-14 NOTE — ED Notes (Signed)
UA/Upreg done at [facility]. Results available in EPIC

## 2023-04-14 NOTE — Discharge Instructions (Signed)
Please go to the ER for further evaluation of your abdominal pain

## 2023-04-14 NOTE — ED Notes (Signed)
Patient is being discharged from the Urgent Care and sent to the Emergency Department via POV . Per Lennox Laity, NP, patient is in need of higher level of care due to the need of further evaluation. Patient is aware and verbalizes understanding of plan of care.  Vitals:   04/14/23 1040  BP: 125/83  Pulse: 75  Resp: 20  Temp: 98.8 F (37.1 C)  SpO2: 98%

## 2023-04-14 NOTE — ED Triage Notes (Signed)
Patient presents with RUQ pain X1 day with N/V. Patient sent by [facility] for further eval r/o cholecystitis

## 2023-04-15 LAB — URINE CULTURE: Culture: NO GROWTH

## 2023-04-16 LAB — URINE CULTURE

## 2023-05-04 ENCOUNTER — Encounter (HOSPITAL_BASED_OUTPATIENT_CLINIC_OR_DEPARTMENT_OTHER): Payer: Self-pay | Admitting: Emergency Medicine

## 2023-05-04 ENCOUNTER — Emergency Department (HOSPITAL_BASED_OUTPATIENT_CLINIC_OR_DEPARTMENT_OTHER)
Admission: EM | Admit: 2023-05-04 | Discharge: 2023-05-05 | Disposition: A | Discharge status: Home / Self Care | Payer: 59 | Attending: Emergency Medicine | Admitting: Emergency Medicine

## 2023-05-04 ENCOUNTER — Other Ambulatory Visit: Payer: Self-pay

## 2023-05-04 DIAGNOSIS — Z1152 Encounter for screening for COVID-19: Secondary | ICD-10-CM | POA: Diagnosis not present

## 2023-05-04 DIAGNOSIS — B349 Viral infection, unspecified: Secondary | ICD-10-CM | POA: Diagnosis not present

## 2023-05-04 DIAGNOSIS — R0981 Nasal congestion: Secondary | ICD-10-CM | POA: Diagnosis present

## 2023-05-04 HISTORY — DX: Zoster without complications: B02.9

## 2023-05-04 NOTE — ED Triage Notes (Signed)
Pt c/o shob, sore throat, and cough since Thursday. Was seen at her pcp on Friday, negative for covid & strep. Pt had two albuterol breathing treatments at home, last one was 2030 tonight.

## 2023-05-05 ENCOUNTER — Emergency Department (HOSPITAL_BASED_OUTPATIENT_CLINIC_OR_DEPARTMENT_OTHER): Payer: 59

## 2023-05-05 LAB — RESP PANEL BY RT-PCR (RSV, FLU A&B, COVID)  RVPGX2
Influenza A by PCR: NEGATIVE
Influenza B by PCR: NEGATIVE
Resp Syncytial Virus by PCR: NEGATIVE
SARS Coronavirus 2 by RT PCR: NEGATIVE

## 2023-05-05 MED ORDER — PREDNISONE 20 MG PO TABS: ORAL_TABLET | ORAL | 0 refills | Status: DC

## 2023-05-05 MED ORDER — PREDNISONE 50 MG PO TABS
60.0000 mg | ORAL_TABLET | Freq: Once | ORAL | Status: AC
  Administered 2023-05-05: 60 mg via ORAL
  Filled 2023-05-05: qty 1

## 2023-05-05 NOTE — ED Provider Notes (Signed)
[place] EMERGENCY DEPARTMENT AT [facility] Provider Note   CSN: [id number] Arrival date & time: 05/04/23  2333     History  Chief Complaint  Patient presents with   Shortness of Breath    Melissa Weeks is a 22 y.o. female.  The history is provided by the patient.  URI Presenting symptoms: congestion, cough and sore throat   Presenting symptoms: no fever   Sore throat:    Severity:  Moderate   Onset quality:  Gradual   Duration:  4 days   Timing:  Constant   Progression:  Unchanged Severity:  Moderate Onset quality:  Gradual Duration:  4 days Timing:  Constant Progression:  Unchanged Chronicity:  New Relieved by:  Nothing Worsened by:  Nothing Ineffective treatments:  None tried Associated symptoms: wheezing   Associated symptoms: no arthralgias   Risk factors: not elderly        Home Medications Prior to Admission medications   Medication Sig Start Date End Date Taking? Authorizing Provider  predniSONE (DELTASONE) 20 MG tablet 3 tabs po day one, then 2 po daily x 4 days 05/05/23  Yes Lerone Onder, MD  cariprazine (VRAYLAR) 1.5 MG capsule 3 mg. 11/29/21   [provider]  levonorgestrel-ethinyl estradiol (VIENVA) 0.1-20 MG-MCG tablet Take 1 tablet by mouth daily. 07/03/21   Dettinger, Elige Radon, MD  omeprazole (PRILOSEC) 20 MG capsule Take 1 capsule (20 mg total) by mouth daily. 04/14/23   Achille Rich, PA-C  traZODone (DESYREL) 100 MG tablet Take 50-100 mg by mouth at bedtime.    [provider]      Allergies    Patient has no known allergies.    Review of Systems   Review of Systems  Constitutional:  Negative for fever.  HENT:  Positive for congestion and sore throat.   Eyes:  Negative for redness.  Respiratory:  Positive for cough and wheezing.   Cardiovascular:  Negative for chest pain, palpitations and leg swelling.  Gastrointestinal:  Negative for vomiting.  Musculoskeletal:  Negative for arthralgias.  All other systems  reviewed and are negative.   Physical Exam Updated Vital Signs BP 135/80   Pulse 80   Temp 98.2 F (36.8 C)   Resp 18   LMP 04/04/2023   SpO2 100%  Physical Exam Vitals and nursing note reviewed.  Constitutional:      General: She is not in acute distress.    Appearance: Normal appearance. She is well-developed.  HENT:     Head: Normocephalic and atraumatic.  Eyes:     Pupils: Pupils are equal, round, and reactive to light.  Cardiovascular:     Rate and Rhythm: Normal rate and regular rhythm.  Pulmonary:     Effort: Pulmonary effort is normal. No respiratory distress.     Breath sounds: Normal breath sounds. No wheezing or rales.  Abdominal:     General: Bowel sounds are normal. There is no distension.     Palpations: Abdomen is soft.     Tenderness: There is no abdominal tenderness. There is no guarding or rebound.  Genitourinary:    Vagina: No vaginal discharge.  Musculoskeletal:        General: Normal range of motion.     Cervical back: Normal range of motion and neck supple.     Comments: Negative Homan's sign   Skin:    General: Skin is warm and dry.     Capillary Refill: Capillary refill takes less than 2 seconds.  Findings: No erythema or rash.  Neurological:     General: No focal deficit present.     Mental Status: She is alert and oriented to person, place, and time.     Deep Tendon Reflexes: Reflexes normal.  Psychiatric:        Mood and Affect: Mood normal.        Behavior: Behavior normal.     ED Results / Procedures / Treatments   Labs (all labs ordered are listed, but only abnormal results are displayed) Labs Reviewed  RESP PANEL BY RT-PCR (RSV, FLU A&B, COVID)  [id number]    EKG None  Radiology DG Chest Portable 1 View  Result Date: 05/05/2023 CLINICAL DATA:  Cough EXAM: PORTABLE CHEST 1 VIEW COMPARISON:  None Available. FINDINGS: The heart size and mediastinal contours are within normal limits. Both lungs are clear. The visualized skeletal  structures are unremarkable. IMPRESSION: No active disease. Electronically Signed   By: Charlett Nose M.D.   On: 05/05/2023 00:30    Procedures Procedures    Medications Ordered in ED Medications  predniSONE (DELTASONE) tablet 60 mg (has no administration in time range)    ED Course/ Medical Decision Making/ A&P                             Medical Decision Making Patient with URI symptoms since Thursday   Amount and/or Complexity of Data Reviewed External Data Reviewed: notes.    Details: Previous notes reviewed  Labs: ordered.    Details: Negative covid and flu  Radiology: ordered.    Details: Negative CXR immediately   Risk Prescription drug management. Risk Details: Well appearing. Lungs are clear.  No travel, no leg pain or swelling.  PERC negative wells negative highly doubt PE in this patient.  Symptoms consistent with viral illness.  As patient reported wheezing PTA will start prednisone.   Follow up with PMD strict return.      Final Clinical Impression(s) / ED Diagnoses Final diagnoses:  Viral illness   Return for intractable cough, coughing up blood, fevers > 100.4 unrelieved by medication, shortness of breath, intractable vomiting, chest pain, shortness of breath, weakness, numbness, changes in speech, facial asymmetry, abdominal pain, passing out, Inability to tolerate liquids or food, cough, altered mental status or any concerns. No signs of systemic illness or infection. The patient is nontoxic-appearing on exam and vital signs are within normal limits.  I have reviewed the triage vital signs and the nursing notes. Pertinent labs & imaging results that were available during my care of the patient were reviewed by me and considered in my medical decision making (see chart for details). After history, exam, and medical workup I feel the patient has been appropriately medically screened and is safe for discharge home. Pertinent diagnoses were discussed with the patient.  Patient was given return precautions. Rx / DC Orders ED Discharge Orders          Ordered    predniSONE (DELTASONE) 20 MG tablet        05/05/23 0042              Kelli Robeck, MD 05/05/23 5409

## 2023-07-07 ENCOUNTER — Encounter (HOSPITAL_COMMUNITY): Payer: Self-pay

## 2023-07-07 ENCOUNTER — Emergency Department (HOSPITAL_COMMUNITY)
Admission: EM | Admit: 2023-07-07 | Discharge: 2023-07-08 | Discharge status: Against Medical Advice | Payer: 59 | Attending: Emergency Medicine | Admitting: Emergency Medicine

## 2023-07-07 ENCOUNTER — Emergency Department (HOSPITAL_COMMUNITY): Payer: 59

## 2023-07-07 ENCOUNTER — Other Ambulatory Visit: Payer: Self-pay

## 2023-07-07 DIAGNOSIS — R55 Syncope and collapse: Secondary | ICD-10-CM | POA: Diagnosis not present

## 2023-07-07 DIAGNOSIS — Z5321 Procedure and treatment not carried out due to patient leaving prior to being seen by health care provider: Secondary | ICD-10-CM | POA: Insufficient documentation

## 2023-07-07 DIAGNOSIS — R002 Palpitations: Secondary | ICD-10-CM | POA: Diagnosis present

## 2023-07-07 DIAGNOSIS — R42 Dizziness and giddiness: Secondary | ICD-10-CM | POA: Diagnosis not present

## 2023-07-07 LAB — URINALYSIS, ROUTINE W REFLEX MICROSCOPIC
Bilirubin Urine: NEGATIVE
Glucose, UA: NEGATIVE mg/dL
Hgb urine dipstick: NEGATIVE
Ketones, ur: NEGATIVE mg/dL
Nitrite: NEGATIVE
Protein, ur: NEGATIVE mg/dL
Specific Gravity, Urine: 1.004 — ABNORMAL LOW (ref 1.005–1.030)
pH: 7 (ref 5.0–8.0)

## 2023-07-07 LAB — CBC
HCT: 43.8 % (ref 36.0–46.0)
Hemoglobin: 14.3 g/dL (ref 12.0–15.0)
MCH: 28.2 pg (ref 26.0–34.0)
MCHC: 32.6 g/dL (ref 30.0–36.0)
MCV: 86.4 fL (ref 80.0–100.0)
Platelets: 252 10*3/uL (ref 150–400)
RBC: 5.07 MIL/uL (ref 3.87–5.11)
RDW: 13.1 % (ref 11.5–15.5)
WBC: 6.7 10*3/uL (ref 4.0–10.5)
nRBC: 0 % (ref 0.0–0.2)

## 2023-07-07 LAB — HEPATIC FUNCTION PANEL
ALT: 29 U/L (ref 0–44)
AST: 24 U/L (ref 15–41)
Albumin: 3.7 g/dL (ref 3.5–5.0)
Alkaline Phosphatase: 61 U/L (ref 38–126)
Bilirubin, Direct: 0.1 mg/dL (ref 0.0–0.2)
Indirect Bilirubin: 0.3 mg/dL (ref 0.3–0.9)
Total Bilirubin: 0.4 mg/dL (ref 0.3–1.2)
Total Protein: 7.2 g/dL (ref 6.5–8.1)

## 2023-07-07 LAB — BASIC METABOLIC PANEL
Anion gap: 11 (ref 5–15)
BUN: 6 mg/dL (ref 6–20)
CO2: 21 mmol/L — ABNORMAL LOW (ref 22–32)
Calcium: 9.4 mg/dL (ref 8.9–10.3)
Chloride: 103 mmol/L (ref 98–111)
Creatinine, Ser: 0.76 mg/dL (ref 0.44–1.00)
GFR, Estimated: >60 mL/min (ref >60)
Glucose, Bld: 95 mg/dL (ref 70–99)
Potassium: 3.6 mmol/L (ref 3.5–5.1)
Sodium: 135 mmol/L (ref 135–145)

## 2023-07-07 LAB — HCG, QUANTITATIVE, PREGNANCY: hCG, Beta Chain, Quant, S: <1 m[IU]/mL (ref <5)

## 2023-07-07 LAB — MAGNESIUM: Magnesium: 1.6 mg/dL — ABNORMAL LOW (ref 1.7–2.4)

## 2023-07-07 LAB — TSH: TSH: 1.558 u[IU]/mL (ref 0.350–4.500)

## 2023-07-07 LAB — T4, FREE: Free T4: 0.98 ng/dL (ref 0.61–1.12)

## 2023-07-07 NOTE — ED Provider Triage Note (Signed)
Emergency Medicine Provider Triage Evaluation Note  Melissa Weeks , a 22 y.o. female  was evaluated in triage.  Pt complains of near syncope  Review of Systems  Positive: Lightheaded episodes, palpitations Negative: Weakness, numbness, vision loss  Physical Exam  BP 118/83   Pulse 73   Temp 98.5 F (36.9 C) (Oral)   Resp 17   Ht 5\' 6"  (1.676 m)   Wt 111.1 kg   LMP 06/26/2023   SpO2 98%   BMI 39.54 kg/m  Gen:   Awake, no distress   Resp:  Normal effort  MSK:   Moves extremities without difficulty    Medical Decision Making  Medically screening exam initiated at 5:22 PM.  Appropriate orders placed.  Haeun Creedon was informed that the remainder of the evaluation will be completed by another provider, this initial triage assessment does not replace that evaluation, and the importance of remaining in the ED until their evaluation is complete.     Virgina Norfolk, DO 07/07/23 1723

## 2023-07-07 NOTE — ED Triage Notes (Signed)
EMS reports patient being at work. Works at a nursing facility. States there were several syncopal episodes (5-6 witnessed by staff). Patient states she feels like her heart rates increases. EMS reports while on the monitor her heart rates increased 30-40 beats. Lowest heart rates, highest was 130 in route.

## 2023-07-07 NOTE — ED Notes (Signed)
Pt left without being seen.

## 2023-09-30 ENCOUNTER — Other Ambulatory Visit (HOSPITAL_COMMUNITY): Payer: Self-pay

## 2023-09-30 ENCOUNTER — Emergency Department (HOSPITAL_BASED_OUTPATIENT_CLINIC_OR_DEPARTMENT_OTHER)
Admission: EM | Admit: 2023-09-30 | Discharge: 2023-09-30 | Disposition: A | Discharge status: Home / Self Care | Payer: Worker's Compensation | Attending: Emergency Medicine | Admitting: Emergency Medicine

## 2023-09-30 ENCOUNTER — Other Ambulatory Visit (HOSPITAL_BASED_OUTPATIENT_CLINIC_OR_DEPARTMENT_OTHER): Payer: Self-pay

## 2023-09-30 ENCOUNTER — Encounter (HOSPITAL_BASED_OUTPATIENT_CLINIC_OR_DEPARTMENT_OTHER): Payer: Self-pay | Admitting: Emergency Medicine

## 2023-09-30 ENCOUNTER — Other Ambulatory Visit: Payer: Self-pay

## 2023-09-30 DIAGNOSIS — Z23 Encounter for immunization: Secondary | ICD-10-CM | POA: Diagnosis not present

## 2023-09-30 DIAGNOSIS — Z7721 Contact with and (suspected) exposure to potentially hazardous body fluids: Secondary | ICD-10-CM | POA: Diagnosis present

## 2023-09-30 DIAGNOSIS — Y990 Civilian activity done for income or pay: Secondary | ICD-10-CM | POA: Diagnosis not present

## 2023-09-30 DIAGNOSIS — W460XXA Contact with hypodermic needle, initial encounter: Secondary | ICD-10-CM | POA: Insufficient documentation

## 2023-09-30 LAB — COMPREHENSIVE METABOLIC PANEL
ALT: 22 U/L (ref 0–44)
AST: 22 U/L (ref 15–41)
Albumin: 3.6 g/dL (ref 3.5–5.0)
Alkaline Phosphatase: 58 U/L (ref 38–126)
Anion gap: 10 (ref 5–15)
BUN: 7 mg/dL (ref 6–20)
CO2: 24 mmol/L (ref 22–32)
Calcium: 8.8 mg/dL — ABNORMAL LOW (ref 8.9–10.3)
Chloride: 101 mmol/L (ref 98–111)
Creatinine, Ser: 0.73 mg/dL (ref 0.44–1.00)
GFR, Estimated: >60 mL/min (ref >60)
Glucose, Bld: 89 mg/dL (ref 70–99)
Potassium: 3.6 mmol/L (ref 3.5–5.1)
Sodium: 135 mmol/L (ref 135–145)
Total Bilirubin: 0.7 mg/dL (ref 0.3–1.2)
Total Protein: 7 g/dL (ref 6.5–8.1)

## 2023-09-30 LAB — CBC WITH DIFFERENTIAL/PLATELET
Abs Immature Granulocytes: 0.04 10*3/uL (ref 0.00–0.07)
Basophils Absolute: 0 10*3/uL (ref 0.0–0.1)
Basophils Relative: 0 %
Eosinophils Absolute: 0 10*3/uL (ref 0.0–0.5)
Eosinophils Relative: 0 %
HCT: 39.7 % (ref 36.0–46.0)
Hemoglobin: 13.1 g/dL (ref 12.0–15.0)
Immature Granulocytes: 1 %
Lymphocytes Relative: 15 %
Lymphs Abs: 1.1 10*3/uL (ref 0.7–4.0)
MCH: 28.3 pg (ref 26.0–34.0)
MCHC: 33 g/dL (ref 30.0–36.0)
MCV: 85.7 fL (ref 80.0–100.0)
Monocytes Absolute: 0.5 10*3/uL (ref 0.1–1.0)
Monocytes Relative: 7 %
Neutro Abs: 5.7 10*3/uL (ref 1.7–7.7)
Neutrophils Relative %: 77 %
Platelets: 183 10*3/uL (ref 150–400)
RBC: 4.63 MIL/uL (ref 3.87–5.11)
RDW: 13.7 % (ref 11.5–15.5)
WBC: 7.4 10*3/uL (ref 4.0–10.5)
nRBC: 0 % (ref 0.0–0.2)

## 2023-09-30 LAB — RAPID HIV SCREEN (HIV 1/2 AB+AG)
HIV 1/2 Antibodies: NONREACTIVE
HIV-1 P24 Antigen - HIV24: NONREACTIVE

## 2023-09-30 LAB — HEPATITIS B SURFACE ANTIGEN: Hepatitis B Surface Ag: NONREACTIVE

## 2023-09-30 MED ORDER — BICTEGRAVIR-EMTRICITAB-TENOFOV 50-200-25 MG PO TABS
1.0000 | ORAL_TABLET | Freq: Every day | ORAL | 0 refills | Status: AC
  Filled 2023-09-30: qty 30, 30d supply, fill #0

## 2023-09-30 MED ORDER — HEPATITIS B IMMUNE GLOBULIN IM SOLN
0.0600 mL/kg | Freq: Once | INTRAMUSCULAR | Status: AC
  Administered 2023-09-30: 6.1 mL via INTRAMUSCULAR
  Filled 2023-09-30: qty 6.5

## 2023-09-30 NOTE — ED Notes (Signed)
Pt to RN station reporting that she was informed by employer that she needs to have employee health blood draw performed at different facility.  EDP [place] made aware.

## 2023-09-30 NOTE — ED Triage Notes (Signed)
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 Right index exposure .

## 2023-09-30 NOTE — Discharge Instructions (Signed)
Take antiviral as prescribed.  You will need repeat testing at 1 month in 3 months.  You can have this arranged through work or with [facility] as discussed.  Lab work thus far is unremarkable.  You can follow-up your remaining blood test on your MyChart.

## 2023-09-30 NOTE — ED Provider Notes (Signed)
[place] [facility]GENCY DEPARTMENT AT [facility] Provider Note   CSN: [id number] Arrival date & time: 09/30/23  1011     History  Chief Complaint  Patient presents with   Body Fluid Exposure    Melissa Weeks is a 22 y.o. female.  Patient here with needlestick at work.  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  Sent here for postexposure treatment.  She is not having any pain.  She washed her hands thoroughly.  Tetanus shot is up-to-date.  However she states that her hepatitis vaccine never seem to create antibodies.  Tetanus shot is up-to-date.  The history is provided by the patient.       Home Medications Prior to Admission medications   Medication Sig Start Date End Date Taking? Authorizing Provider  bictegravir-emtricitabine-tenofovir AF (BIKTARVY) 50-200-25 MG TABS tablet Take 1 tablet by mouth daily. 09/30/23 10/30/23 Yes [place] Warriner, DO  cariprazine (VRAYLAR) 1.5 MG capsule 3 mg. 11/29/21   [provider]  levonorgestrel-ethinyl estradiol (VIENVA) 0.1-20 MG-MCG tablet Take 1 tablet by mouth daily. 07/03/21   Dettinger, Elige Radon, MD  omeprazole (PRILOSEC) 20 MG capsule Take 1 capsule (20 mg total) by mouth daily. 04/14/23   Achille Rich, PA-C  predniSONE (DELTASONE) 20 MG tablet 3 tabs po day one, then 2 po daily x 4 days 05/05/23   Palumbo, April, MD  traZODone (DESYREL) 100 MG tablet Take 50-100 mg by mouth at bedtime.    [provider]      Allergies    Patient has no known allergies.    Review of Systems   Review of Systems  Physical Exam Updated Vital Signs BP 113/74   Pulse (!) 58   Temp 97.8 F (36.6 C) (Oral)   Resp 15   Wt 102.1 kg   LMP 09/16/2023 (Approximate)   SpO2 99%   BMI 36.32 kg/m  Physical Exam Vitals and nursing note reviewed.  Constitutional:      General: She is not in acute distress.    Appearance: She is well-developed. She is not ill-appearing.  HENT:     Head:  Normocephalic and atraumatic.     Nose: Nose normal.     Mouth/Throat:     Mouth: Mucous membranes are moist.  Eyes:     Extraocular Movements: Extraocular movements intact.     Conjunctiva/sclera: Conjunctivae normal.     Pupils: Pupils are equal, round, and reactive to light.  Cardiovascular:     Rate and Rhythm: Normal rate and regular rhythm.     Pulses: Normal pulses.     Heart sounds: Normal heart sounds. No murmur heard. Pulmonary:     Effort: Pulmonary effort is normal. No respiratory distress.     Breath sounds: Normal breath sounds.  Abdominal:     General: Abdomen is flat.     Palpations: Abdomen is soft.     Tenderness: There is no abdominal tenderness.  Musculoskeletal:        General: No swelling.     Cervical back: Normal range of motion and neck supple.  Skin:    General: Skin is warm and dry.     Capillary Refill: Capillary refill takes less than 2 seconds.  Neurological:     General: No focal deficit present.     Mental Status: She is alert and oriented to person, place, and time.     Cranial Nerves: No cranial nerve deficit.     Sensory: No  sensory deficit.     Motor: No weakness.     Coordination: Coordination normal.  Psychiatric:        Mood and Affect: Mood normal.     ED Results / Procedures / Treatments   Labs (all labs ordered are listed, but only abnormal results are displayed) Labs Reviewed  COMPREHENSIVE METABOLIC PANEL - Abnormal; Notable for the following components:      Result Value   Calcium 8.8 (*)    All other components within normal limits  RAPID HIV SCREEN (HIV 1/2 AB+AG)  CBC WITH DIFFERENTIAL/PLATELET  HEPATITIS B SURFACE ANTIGEN  HEPATITIS B SURFACE ANTIBODY, QUANTITATIVE  HCV AB W REFLEX TO QUANT PCR    EKG None  Radiology No results found.  Procedures Procedures    Medications Ordered in ED Medications  hepatitis B immune globulin injection 6.1 mL (has no administration in time range)    ED Course/ Medical  Decision Making/ A&P                                 Medical Decision Making Amount and/or Complexity of Data Reviewed Labs: ordered.  Risk Prescription drug management.   Melissa Weeks is here after needlestick at work.  Sent here for postexposure treatment.  I talked with Dr. Drue Second with infectious disease.  I am able to see that her hepatitis antibodies have been in the nonimmune range here recently.  Dr. Allyne Gee recommended hepatitis B immunoglobulin.  Will get hepatitis B antigen and antibody testing as well as hepatitis C testing, CBC and CMP and HIV testing.  She does recommend starting patient on Biktarvy 1 tablet daily for 30 days.  Overall sounds like patient was supposed to go to [facility] employee health but given that she is here and already have immunoglobulin sent we will have her get the immunoglobulin and then send her over to employee health to help hopefully cover the cost of Biktarvy.  She was also educated about how to get this prescription for free through http://www.peck.info/.  Overall patient got immunoglobulin.  I talked with: Employee health who just recommended she get labs and workup done here given that she needs immunoglobulin.  Patient understands that she needs repeat testing at 1 month and 3 months that can be done either at [facility] or at her job.  Labs unremarkable thus far.  Patient tolerated immunoglobulin well.  Discharged in good condition.Precautions.  This chart was dictated using voice recognition software.  Despite best efforts to proofread,  errors can occur which can change the documentation meaning.         Final Clinical Impression(s) / ED Diagnoses Final diagnoses:  Accident caused by hypodermic needle, initial encounter    Rx / DC Orders ED Discharge Orders          Ordered    bictegravir-emtricitabine-tenofovir AF (BIKTARVY) 50-200-25 MG TABS tablet  Daily        09/30/23 1156              Virgina Norfolk, DO 09/30/23 1312

## 2023-09-30 NOTE — ED Notes (Signed)
ED Provider at bedside. 

## 2023-10-01 LAB — HEPATITIS B SURFACE ANTIBODY, QUANTITATIVE: Hep B S AB Quant (Post): 3430 m[IU]/mL

## 2023-10-01 LAB — HCV INTERPRETATION

## 2023-10-01 LAB — HCV AB W REFLEX TO QUANT PCR: HCV Ab: NONREACTIVE

## 2024-08-12 ENCOUNTER — Encounter: Payer: Self-pay | Admitting: Nurse Practitioner

## 2024-08-12 ENCOUNTER — Ambulatory Visit: Admitting: Nurse Practitioner

## 2024-08-12 VITALS — BP 130/86 | HR 90 | Temp 97.2°F

## 2024-08-12 DIAGNOSIS — Z789 Other specified health status: Secondary | ICD-10-CM

## 2024-08-12 NOTE — Progress Notes (Signed)
 Occupational Health- [facility]  Subjective:  Patient ID: Melissa Weeks, female    DOB: Jul 08, 2001  Age: 23 y.o. MRN: [id number]  CC: Wellness Exam   HPI Melissa Weeks presents for wellness exam visit for insurance benefit.  Patient has a PCP: Melissa Weeks [facility]H significant for: Anxiety and Depression. Takes hydroxyzine. PCOS  Last labs per PCP were completed: Last week via GYN.  Health Maintenance:  Colonoscopy: denies immediate family hx.  Mammogram: denies family hx.  Pap: Completed last week, was normal.     Smoker: quit vaping x 1 year.   Immunizations:  Tdap- up to date Flu- gets this yearly.   Lifestyle: Diet- no particular diet, is working on having a healthier diet.  Exercise- walks around apartment complex and goes to the gym occasionally.    Needlestick injury on the job in October, received hep B immunoglobulins. Was recommend repeat testing 1 month and 3 months out. Unfortunately patient was lost to follow-up.  Past Medical History:  Diagnosis Date   Anemia    Asthma    Shingles     Past Surgical History:  Procedure Laterality Date   ADENOIDECTOMY     APPENDECTOMY     TONSILLECTOMY      Outpatient Medications Prior to Visit  Medication Sig Dispense Refill   hydrOXYzine (ATARAX) 25 MG tablet 1 tablet as needed     levonorgestrel-ethinyl estradiol (VIENVA) 0.1-20 MG-MCG tablet Take 1 tablet by mouth daily. 84 tablet 0   omeprazole (PRILOSEC) 20 MG capsule Take 1 capsule (20 mg total) by mouth daily. (Patient not taking: Reported on 08/12/2024) 14 capsule 0   predniSONE (DELTASONE) 20 MG tablet 3 tabs po day one, then 2 po daily x 4 days (Patient not taking: Reported on 08/12/2024) 11 tablet 0   traZODone (DESYREL) 100 MG tablet Take 50-100 mg by mouth at bedtime. (Patient not taking: Reported on 08/12/2024)     cariprazine (VRAYLAR) 1.5 MG capsule 3 mg.     No facility-administered medications prior to visit.    ROS Review of Systems  Eyes:  Negative  for visual disturbance.  Respiratory:  Negative for shortness of breath.   Cardiovascular:  Negative for chest pain.  Gastrointestinal:  Negative for constipation, diarrhea, nausea and vomiting.  Musculoskeletal: Negative.   Neurological:  Negative for dizziness and headaches.  Psychiatric/Behavioral:  Negative for sleep disturbance. The patient is not nervous/anxious.     Objective:  BP 130/86 (BP Location: Left Arm, Patient Position: Sitting, Cuff Size: Normal)   Pulse 90   Temp (!) 97.2 F (36.2 C) (Temporal)   LMP 07/22/2024 (Exact Date)   SpO2 98%   Physical Exam Constitutional:      General: She is not in acute distress. HENT:     Head: Normocephalic.     Right Ear: Tympanic membrane, ear canal and external ear normal.     Left Ear: Tympanic membrane, ear canal and external ear normal.     Mouth/Throat:     Pharynx: Oropharynx is clear. No oropharyngeal exudate or posterior oropharyngeal erythema.  Eyes:     Pupils: Pupils are equal, round, and reactive to light.  Cardiovascular:     Rate and Rhythm: Normal rate and regular rhythm.     Heart sounds: Normal heart sounds.  Pulmonary:     Effort: Pulmonary effort is normal.     Breath sounds: Normal breath sounds.  Abdominal:     Palpations: Abdomen is soft.  Musculoskeletal:  General: Normal range of motion.  Skin:    General: Skin is warm.  Neurological:     General: No focal deficit present.     Mental Status: She is alert and oriented to person, place, and time.  Psychiatric:        Mood and Affect: Mood normal.        Behavior: Behavior normal.      Assessment & Plan:    [name] was seen today for wellness exam.  Diagnoses and all orders for this visit:  Participant in health and wellness plan Adult wellness physical was conducted today. Importance of diet and exercise were discussed in detail.  We reviewed immunizations and gave recommendations regarding current immunization needed for age.   Preventative health exams are up to date.  Did recommended she follow-up with [facility] for recommendations on follow-up testing for needlestick accident  back in October. .    Patient was advised yearly wellness exam    No orders of the defined types were placed in this encounter.   No orders of the defined types were placed in this encounter.   Follow-up: as needed
